# Patient Record
Sex: Male | Born: 1965 | Race: White | Hispanic: No | Marital: Married | Smoking: Former smoker
Health system: Southern US, Community
[De-identification: ages and names within clinical notes are randomized; demographics above are authoritative.]

## PROBLEM LIST (undated history)

## (undated) HISTORY — PX: EYE SURGERY: SHX253

---

## 2008-02-23 ENCOUNTER — Ambulatory Visit: Payer: Self-pay | Admitting: Orthopedic Surgery

## 2008-02-23 DIAGNOSIS — M1710 Unilateral primary osteoarthritis, unspecified knee: Secondary | ICD-10-CM

## 2008-02-23 DIAGNOSIS — M25569 Pain in unspecified knee: Secondary | ICD-10-CM

## 2008-02-23 DIAGNOSIS — M25469 Effusion, unspecified knee: Secondary | ICD-10-CM

## 2008-02-23 DIAGNOSIS — M23302 Other meniscus derangements, unspecified lateral meniscus, unspecified knee: Secondary | ICD-10-CM

## 2008-03-08 ENCOUNTER — Ambulatory Visit: Payer: Self-pay | Admitting: Orthopedic Surgery

## 2008-04-10 ENCOUNTER — Ambulatory Visit: Payer: Self-pay | Admitting: Orthopedic Surgery

## 2009-08-08 ENCOUNTER — Ambulatory Visit (HOSPITAL_COMMUNITY)
Admission: RE | Admit: 2009-08-08 | Discharge: 2009-08-08 | Payer: Self-pay | Admitting: Physical Medicine and Rehabilitation

## 2018-10-04 ENCOUNTER — Emergency Department (HOSPITAL_COMMUNITY): Payer: BLUE CROSS/BLUE SHIELD

## 2018-10-04 ENCOUNTER — Emergency Department (HOSPITAL_COMMUNITY)
Admission: EM | Admit: 2018-10-04 | Discharge: 2018-10-04 | Disposition: A | Discharge status: Home / Self Care | Payer: BLUE CROSS/BLUE SHIELD | Attending: Emergency Medicine | Admitting: Emergency Medicine

## 2018-10-04 ENCOUNTER — Other Ambulatory Visit: Payer: Self-pay

## 2018-10-04 ENCOUNTER — Encounter (HOSPITAL_COMMUNITY): Payer: Self-pay | Admitting: Emergency Medicine

## 2018-10-04 DIAGNOSIS — F17220 Nicotine dependence, chewing tobacco, uncomplicated: Secondary | ICD-10-CM | POA: Diagnosis not present

## 2018-10-04 DIAGNOSIS — R42 Dizziness and giddiness: Secondary | ICD-10-CM | POA: Diagnosis not present

## 2018-10-04 DIAGNOSIS — Z79899 Other long term (current) drug therapy: Secondary | ICD-10-CM | POA: Diagnosis not present

## 2018-10-04 LAB — URINALYSIS, ROUTINE W REFLEX MICROSCOPIC
BILIRUBIN URINE: NEGATIVE
Bacteria, UA: NONE SEEN
Glucose, UA: NEGATIVE mg/dL
KETONES UR: NEGATIVE mg/dL
Leukocytes, UA: NEGATIVE
NITRITE: NEGATIVE
PH: 7 (ref 5.0–8.0)
Protein, ur: NEGATIVE mg/dL
Specific Gravity, Urine: 1.003 — ABNORMAL LOW (ref 1.005–1.030)

## 2018-10-04 LAB — DIFFERENTIAL
Basophils Absolute: 0 10*3/uL (ref 0.0–0.1)
Basophils Relative: 0 %
Eosinophils Absolute: 0.1 10*3/uL (ref 0.0–0.5)
Eosinophils Relative: 1 %
Lymphocytes Relative: 17 %
Lymphs Abs: 1.3 10*3/uL (ref 0.7–4.0)
MONO ABS: 0.3 10*3/uL (ref 0.1–1.0)
Monocytes Relative: 4 %
Neutro Abs: 5.7 10*3/uL (ref 1.7–7.7)
Neutrophils Relative %: 77 %

## 2018-10-04 LAB — CBC
HCT: 44.5 % (ref 39.0–52.0)
HEMOGLOBIN: 15.3 g/dL (ref 13.0–17.0)
MCH: 30.1 pg (ref 26.0–34.0)
MCHC: 34.4 g/dL (ref 30.0–36.0)
MCV: 87.6 fL (ref 80.0–100.0)
NRBC: 0 % (ref 0.0–0.2)
Platelets: 198 10*3/uL (ref 150–400)
RBC: 5.08 MIL/uL (ref 4.22–5.81)
RDW: 12.4 % (ref 11.5–15.5)
WBC: 7.4 10*3/uL (ref 4.0–10.5)

## 2018-10-04 LAB — HEPATIC FUNCTION PANEL
ALT: 41 U/L (ref 0–44)
AST: 23 U/L (ref 15–41)
Albumin: 4.4 g/dL (ref 3.5–5.0)
Alkaline Phosphatase: 47 U/L (ref 38–126)
BILIRUBIN DIRECT: 0.2 mg/dL (ref 0.0–0.2)
BILIRUBIN INDIRECT: 1.1 mg/dL — AB (ref 0.3–0.9)
Total Bilirubin: 1.3 mg/dL — ABNORMAL HIGH (ref 0.3–1.2)
Total Protein: 6.9 g/dL (ref 6.5–8.1)

## 2018-10-04 LAB — BASIC METABOLIC PANEL
ANION GAP: 5 (ref 5–15)
BUN: 15 mg/dL (ref 6–20)
CO2: 25 mmol/L (ref 22–32)
Calcium: 8.5 mg/dL — ABNORMAL LOW (ref 8.9–10.3)
Chloride: 106 mmol/L (ref 98–111)
Creatinine, Ser: 0.87 mg/dL (ref 0.61–1.24)
Glucose, Bld: 107 mg/dL — ABNORMAL HIGH (ref 70–99)
POTASSIUM: 3.4 mmol/L — AB (ref 3.5–5.1)
SODIUM: 136 mmol/L (ref 135–145)

## 2018-10-04 LAB — CBG MONITORING, ED: Glucose-Capillary: 106 mg/dL — ABNORMAL HIGH (ref 70–99)

## 2018-10-04 LAB — TROPONIN I

## 2018-10-04 MED ORDER — SODIUM CHLORIDE 0.9 % IV BOLUS
1000.0000 mL | Freq: Once | INTRAVENOUS | Status: AC
  Administered 2018-10-04: 1000 mL via INTRAVENOUS

## 2018-10-04 NOTE — ED Provider Notes (Signed)
[facility] EMERGENCY DEPARTMENT Provider Note   CSN: [id number] Arrival date & time: 10/04/18  1534     History   Chief Complaint Chief Complaint  Patient presents with  . Dizziness    HPI Jonathon Balllan Morgan is a 52 y.o. male.  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  Patient now feels much better in the emergency department  The history is provided by the patient. No language interpreter was used.  Dizziness  Quality:  Lightheadedness Severity:  Mild Timing:  Rare Progression:  Resolved Chronicity:  New Context: bending over   Relieved by:  Nothing Worsened by:  Nothing Ineffective treatments:  None tried Associated symptoms: no chest pain, no diarrhea and no headaches     History reviewed. No pertinent past medical history.  Patient Active Problem List   Diagnosis Date Noted  . KNEE, ARTHRITIS, DEGEN./OSTEO 02/23/2008  . DERANGEMENT MENISCUS 02/23/2008  . JOINT EFFUSION, RIGHT KNEE 02/23/2008  . KNEE PAIN 02/23/2008    Past Surgical History:  Procedure Laterality Date  . EYE SURGERY          Home Medications    Prior to Admission medications   Medication Sig Start Date End Date Taking? Authorizing Provider  Flaxseed, Linseed, (FLAXSEED OIL) 1000 MG CAPS Take 1 capsule by mouth daily.   Yes [provider]  Multiple Vitamin (MULTIVITAMIN WITH MINERALS) TABS tablet Take 1 tablet by mouth daily.   Yes [provider]    Family History Family History  Problem Relation Age of Onset  . Heart attack Other     Social History Social History   Tobacco Use  . Smoking status: Former Games developermoker  . Smokeless tobacco: Current User    Types: Snuff  Substance Use Topics  . Alcohol use: Yes    Comment: occas  . Drug use: Never     Allergies   Patient has no known allergies.   Review of Systems Review of Systems    Constitutional: Negative for appetite change and fatigue.  HENT: Negative for congestion, ear discharge and sinus pressure.   Eyes: Negative for discharge.  Respiratory: Negative for cough.   Cardiovascular: Negative for chest pain.  Gastrointestinal: Negative for abdominal pain and diarrhea.  Genitourinary: Negative for frequency and hematuria.  Musculoskeletal: Negative for back pain.  Skin: Negative for rash.  Neurological: Positive for dizziness. Negative for seizures and headaches.  Psychiatric/Behavioral: Negative for hallucinations.     Physical Exam Updated Vital Signs BP 114/83   Pulse 70   Temp 98 F (36.7 C) (Oral)   Resp 20   Ht 5\' 9"  (1.753 m)   Wt 111.1 kg   SpO2 98%   BMI 36.18 kg/m   Physical Exam Constitutional:      Appearance: He is well-developed.  HENT:     Head: Normocephalic.     Right Ear: Tympanic membrane normal.     Nose: Nose normal.     Mouth/Throat:     Mouth: Mucous membranes are moist.  Eyes:     General: No scleral icterus.    Conjunctiva/sclera: Conjunctivae normal.  Neck:     Musculoskeletal: Neck supple.     Thyroid: No thyromegaly.  Cardiovascular:     Rate and Rhythm: Normal rate and regular rhythm.     Heart sounds: No murmur. No friction rub. No gallop.  Pulmonary:     Breath sounds: No stridor. No wheezing or rales.  Chest:     Chest wall: No tenderness.  Abdominal:     General: There is no distension.     Tenderness: There is no abdominal tenderness. There is no rebound.  Musculoskeletal: Normal range of motion.  Lymphadenopathy:     Cervical: No cervical adenopathy.  Skin:    Findings: No erythema or rash.  Neurological:     Mental Status: He is oriented to person, place, and time.     Motor: No abnormal muscle tone.     Coordination: Coordination normal.  Psychiatric:        Behavior: Behavior normal.      ED Treatments / Results  Labs (all labs ordered are listed, but only abnormal results are  displayed) Labs Reviewed  BASIC METABOLIC PANEL - Abnormal; Notable for the following components:      Result Value   Potassium 3.4 (*)    Glucose, Bld 107 (*)    Calcium 8.5 (*)    All other components within normal limits  URINALYSIS, ROUTINE W REFLEX MICROSCOPIC - Abnormal; Notable for the following components:   Color, Urine STRAW (*)    Specific Gravity, Urine 1.003 (*)    Hgb urine dipstick SMALL (*)    All other components within normal limits  HEPATIC FUNCTION PANEL - Abnormal; Notable for the following components:   Total Bilirubin 1.3 (*)    Indirect Bilirubin 1.1 (*)    All other components within normal limits  CBG MONITORING, ED - Abnormal; Notable for the following components:   Glucose-Capillary 106 (*)    All other components within normal limits  CBC  TROPONIN I  DIFFERENTIAL    EKG EKG Interpretation  Date/Time:  Monday October 04 2018 16:15:38 EST Ventricular Rate:  72 PR Interval:    QRS Duration: 98 QT Interval:  404 QTC Calculation: 443 R Axis:   -29 Text Interpretation:  Sinus rhythm Borderline left axis deviation Consider anterior infarct Baseline wander in lead(s) V1 Confirmed by Bethann Berkshire [phone]) on 10/04/2018 7:26:07 PM   Radiology Dg Chest 2 View  Result Date: 10/04/2018 CLINICAL DATA:  Chest pain and near syncope. EXAM: CHEST - 2 VIEW COMPARISON:  None. FINDINGS: The heart size and mediastinal contours are within normal limits. Normal pulmonary vascularity. No focal consolidation, pleural effusion, or pneumothorax. No acute osseous abnormality. IMPRESSION: No active cardiopulmonary disease. Electronically Signed   By: Obie Dredge M.D.   On: 10/04/2018 17:20   Ct Head Wo Contrast  Result Date: 10/04/2018 CLINICAL DATA:  Dizziness EXAM: CT HEAD WITHOUT CONTRAST TECHNIQUE: Contiguous axial images were obtained from the base of the skull through the vertex without intravenous contrast. COMPARISON:  None. FINDINGS: Brain: No evidence of  acute infarction, hemorrhage, hydrocephalus, extra-axial collection or mass lesion/mass effect. Vascular: No hyperdense vessel or unexpected calcification. Skull: Normal. Negative for fracture or focal lesion. Sinuses/Orbits: Postsurgical changes involving the left globe. The visualized paranasal sinuses are essentially clear. The mastoid air cells are unopacified. Other: None. IMPRESSION: No evidence of acute intracranial abnormality. Electronically Signed   By: Charline Bills M.D.   On: 10/04/2018 17:00    Procedures Procedures (including critical care time)  Medications Ordered in ED Medications  sodium chloride 0.9 % bolus 1,000 mL (1,000 mLs Intravenous New Bag/Given 10/04/18 1832)  sodium chloride 0.9 % bolus 1,000 mL (1,000 mLs Intravenous New Bag/Given 10/04/18 1831)     Initial Impression / Assessment  and Plan / ED Course  I have reviewed the triage vital signs and the nursing notes.  Pertinent labs & imaging results that were available during my care of the patient were reviewed by me and considered in my medical decision making (see chart for details).     Labs unremarkable.  Patient was orthostatic.  So he was given some fluids.  Patient symptoms improved with fluids.  Doubt coronary artery disease or stroke.  Patient will follow-up with PCP  Final Clinical Impressions(s) / ED Diagnoses   Final diagnoses:  Dizziness    ED Discharge Orders    None       Bethann BerkshireZammit, Yarianna Varble, MD 10/04/18 [phone]

## 2018-10-04 NOTE — Discharge Instructions (Signed)
Follow-up with your family doctor next week for recheck.  Return if any problems °

## 2018-10-04 NOTE — ED Triage Notes (Signed)
Patient reports dizziness that started on Friday. Patient states he feels 'kind of off balance." Reports onset of tingling in his hands and feet about an hour ago. C/O L sided chest pain that feels like pressure.

## 2018-10-04 NOTE — ED Notes (Signed)
Pt to CT and xray  

## 2018-10-04 NOTE — ED Notes (Signed)
Pt states no dizziness while doing orthostatics. Request RN to check his ears for fluid. RN notified.

## 2020-01-21 IMAGING — CT CT HEAD W/O CM
3 series · 16 of 47 positions shown, 19 images · non-contrast
Comparison: None.

CLINICAL DATA: Dizziness

EXAM:
CT HEAD WITHOUT CONTRAST
TECHNIQUE: Contiguous axial images were obtained from the base of the skull
through the vertex without intravenous contrast.

[Series 2: head wo · axial · 0.43mm/px · z∈[+28,+158]mm · 10 of 32 slices shown, 13 images]
[im 3/32  brain]
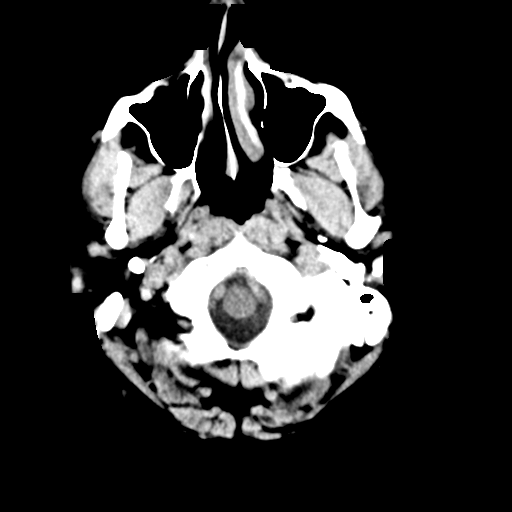
[im 3/32  bone]
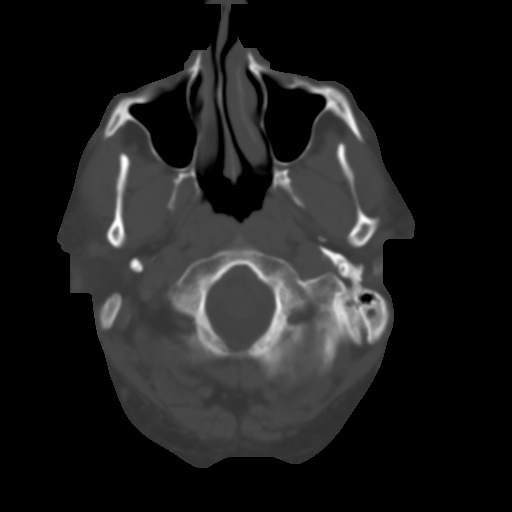
[im 6/32  brain]
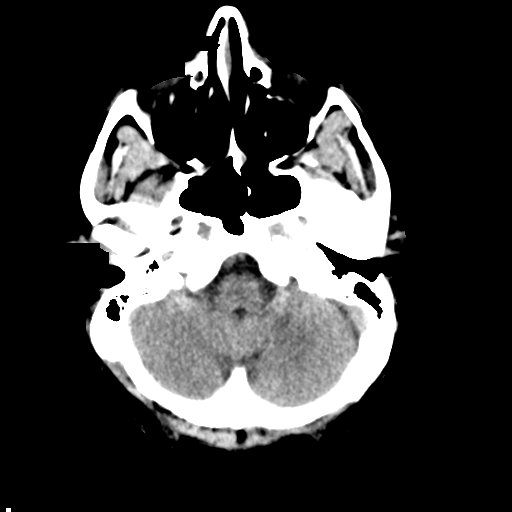
[im 9/32  brain]
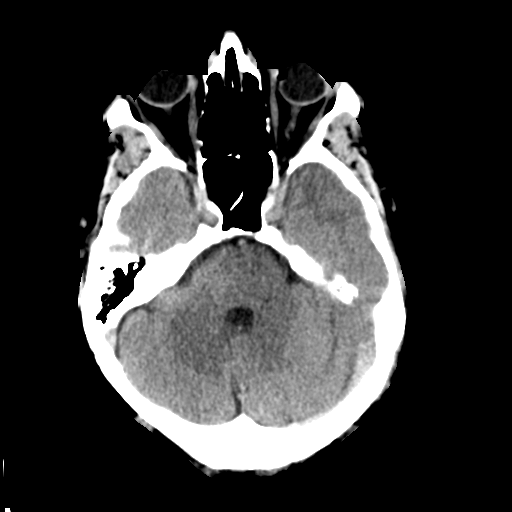
[im 11/32  brain]
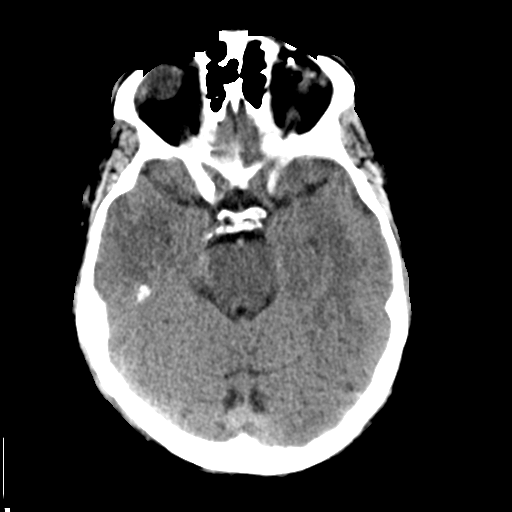
[im 14/32  brain]
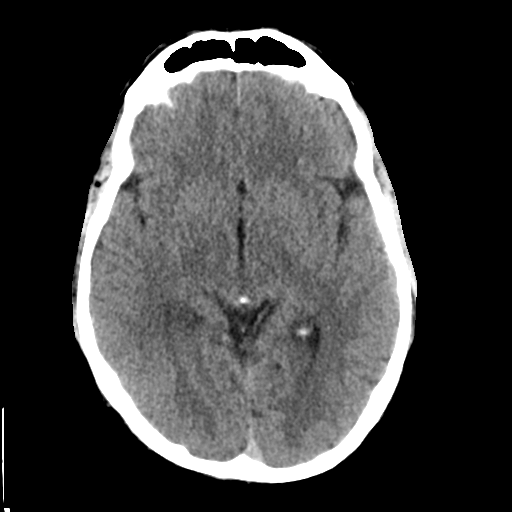
[im 14/32  bone]
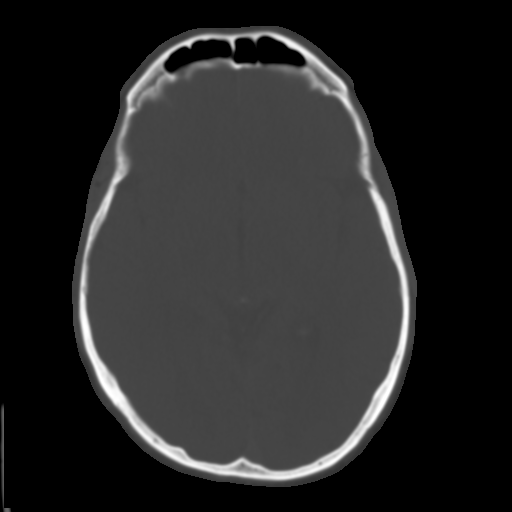
[im 18/32  brain]
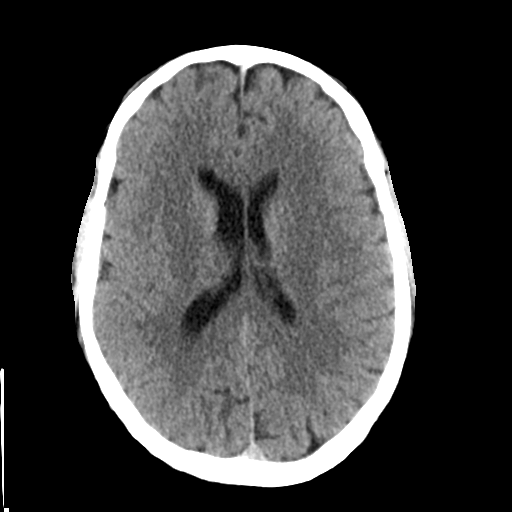
[im 21/32  brain]
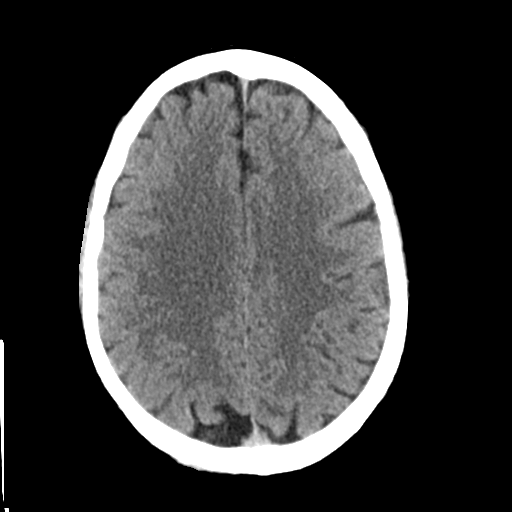
[im 24/32  brain]
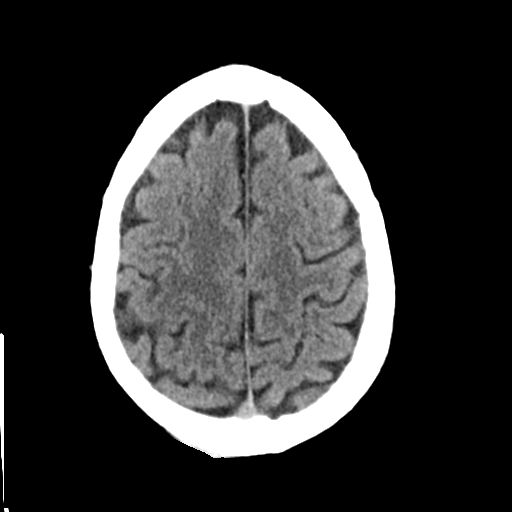
[im 26/32  brain]
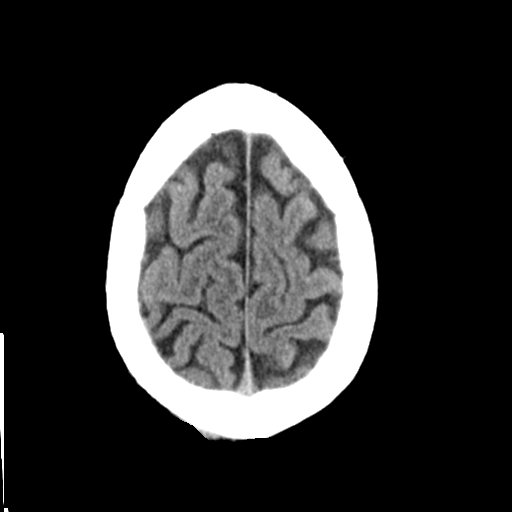
[im 26/32  bone]
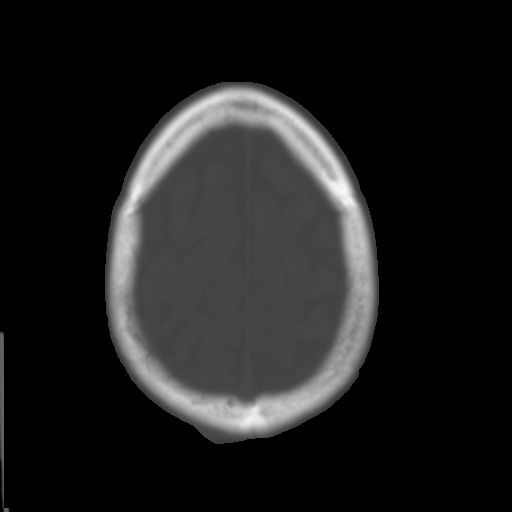
[im 29/32  brain]
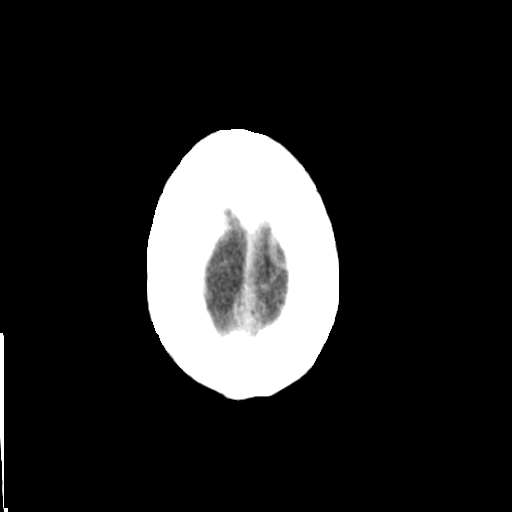

[Series 4: coronal soft tissue · coronal · 0.33mm/px · 3 of 70 slices shown]
[im 24/70  brain]
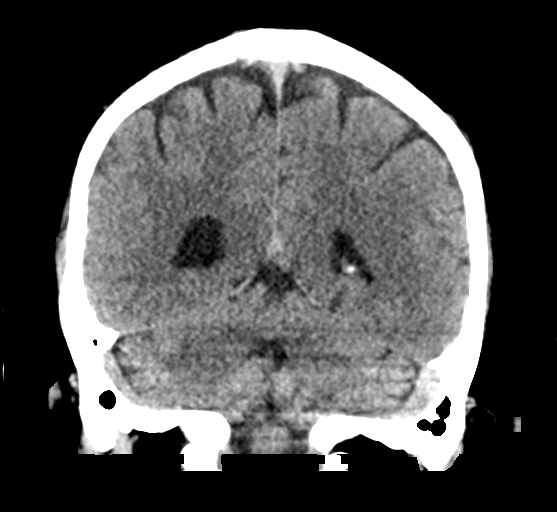
[im 31/70  brain]
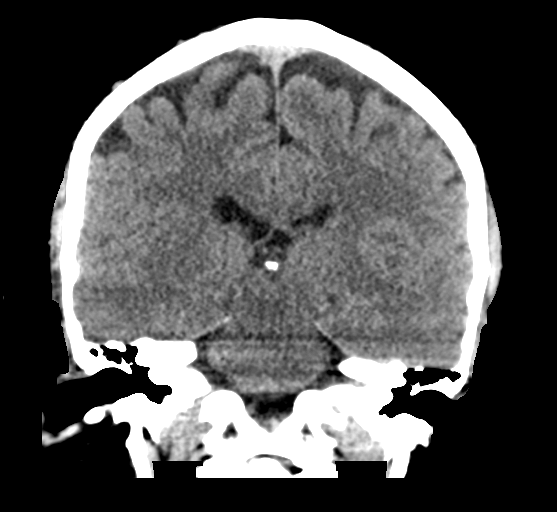
[im 39/70  brain]
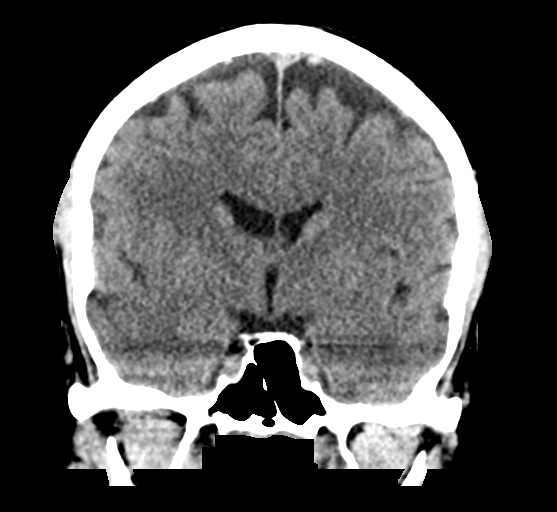

[Series 5: sagittal soft tissue · sagittal · 0.33mm/px · 3 of 57 slices shown]
[im 19/57  brain]
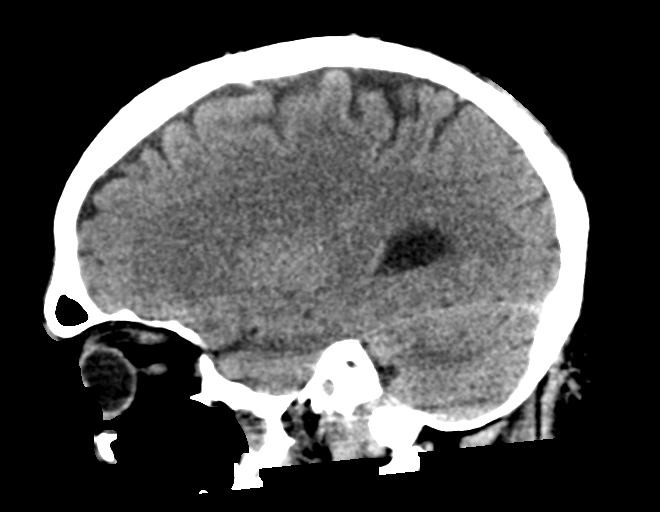
[im 29/57  brain]
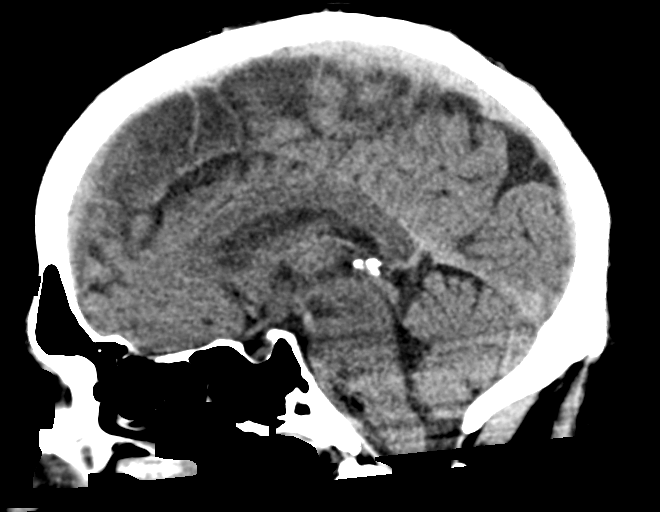
[im 38/57  brain]
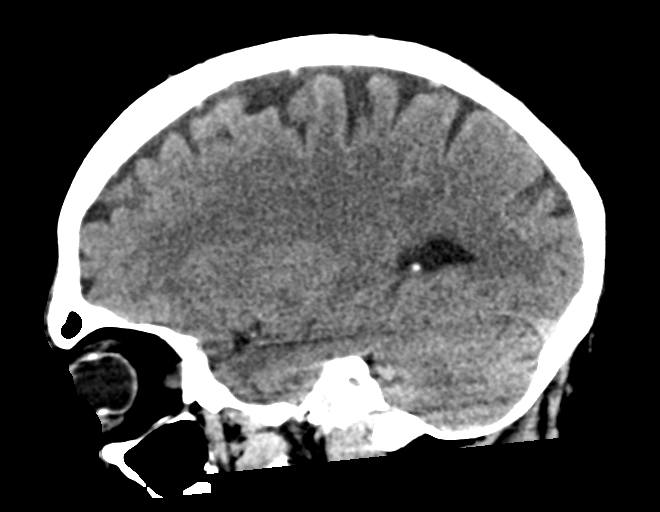

[16 of 47 positions shown; findings below may reference images not displayed]

FINDINGS: Brain: No evidence of acute infarction, hemorrhage, hydrocephalus,
extra-axial collection or mass lesion/mass effect.

Vascular: No hyperdense vessel or unexpected calcification.

Skull: Normal. Negative for fracture or focal lesion.

Sinuses/Orbits: Postsurgical changes involving the left globe. The
visualized paranasal sinuses are essentially clear. The mastoid air
cells are unopacified.

Other: None.
IMPRESSION: No evidence of acute intracranial abnormality.

## 2020-01-21 IMAGING — DX DG CHEST 2V
2 series · 2 of 2 positions shown · non-contrast
Comparison: None.

CLINICAL DATA: Chest pain and near syncope.

EXAM:
CHEST - 2 VIEW

[chest pa]
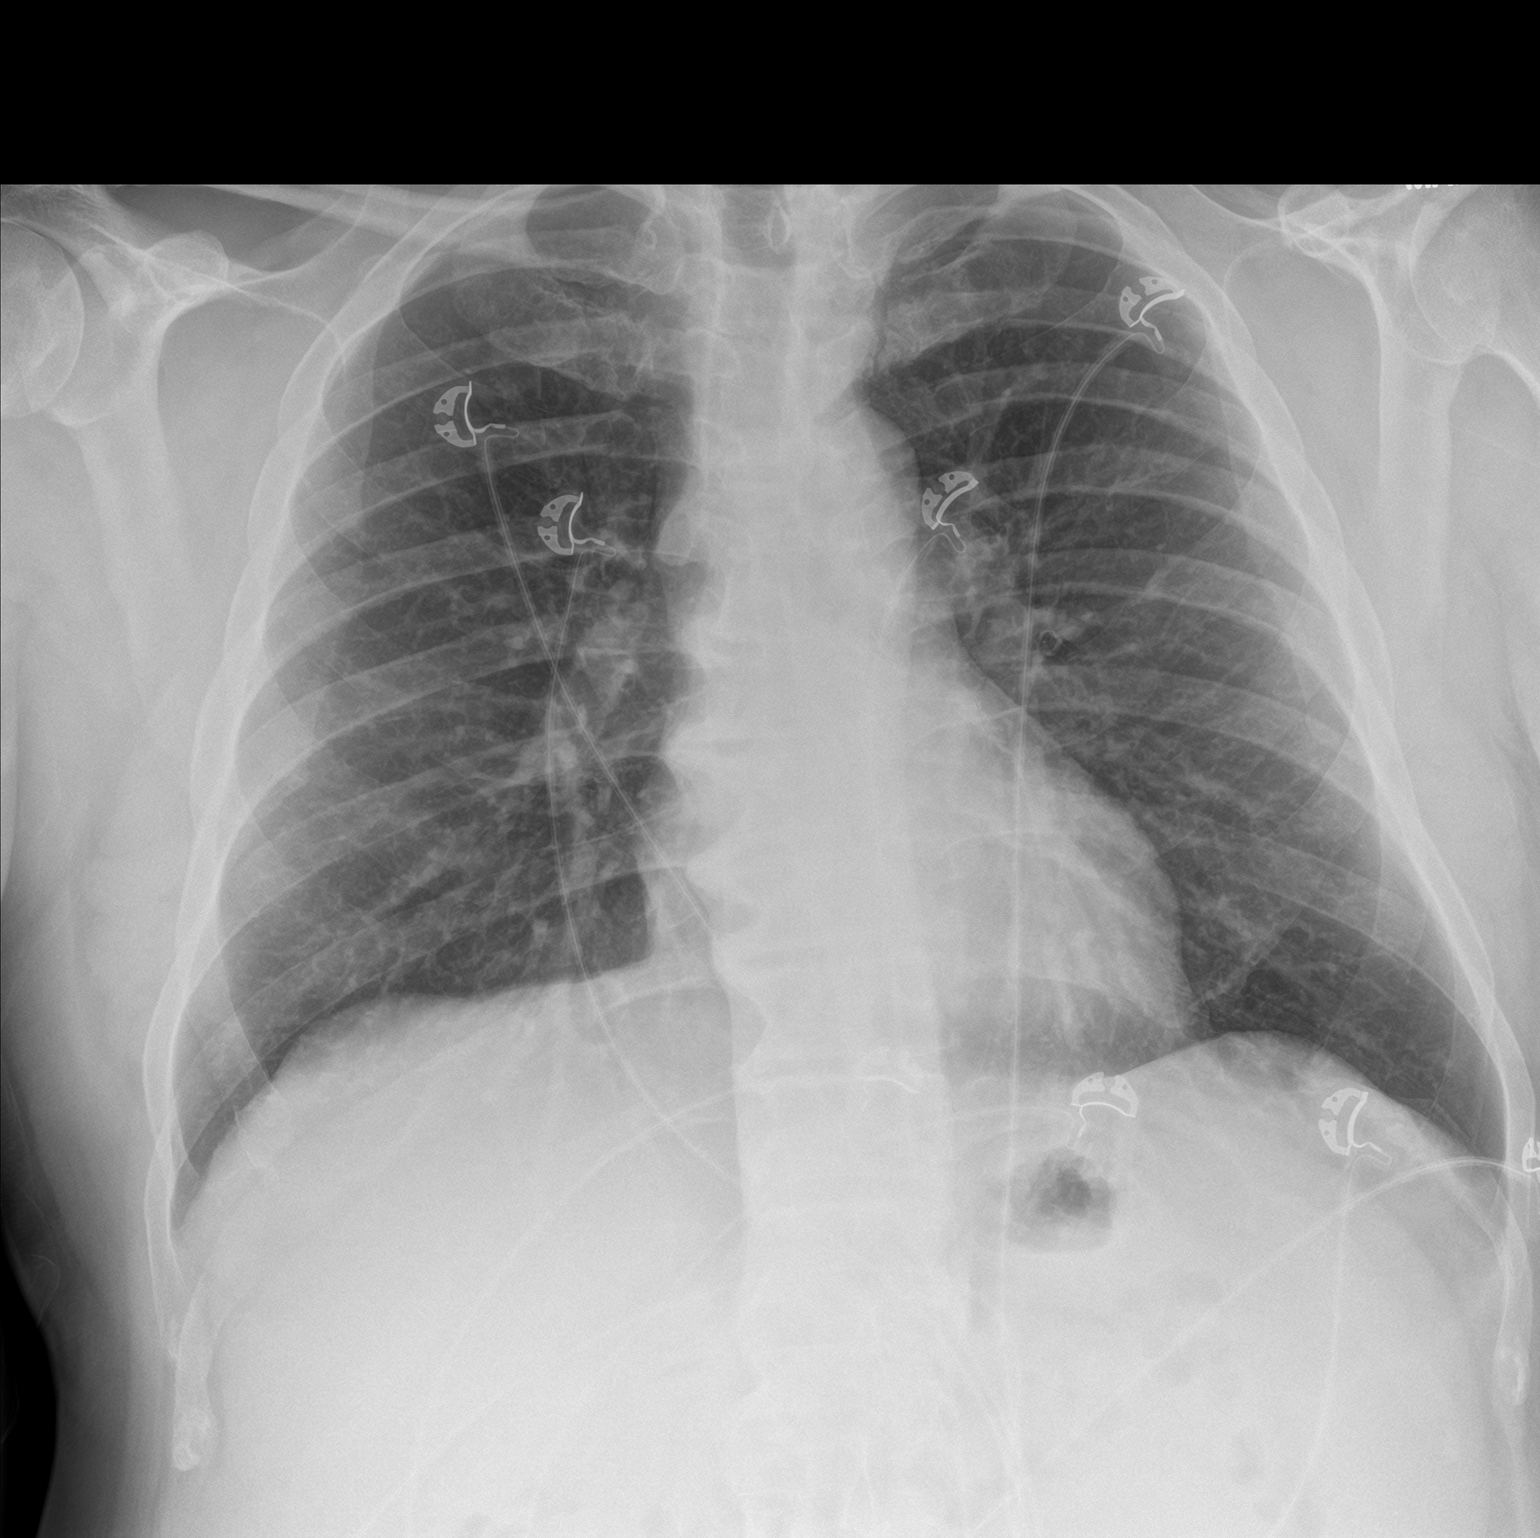

[chest lat]
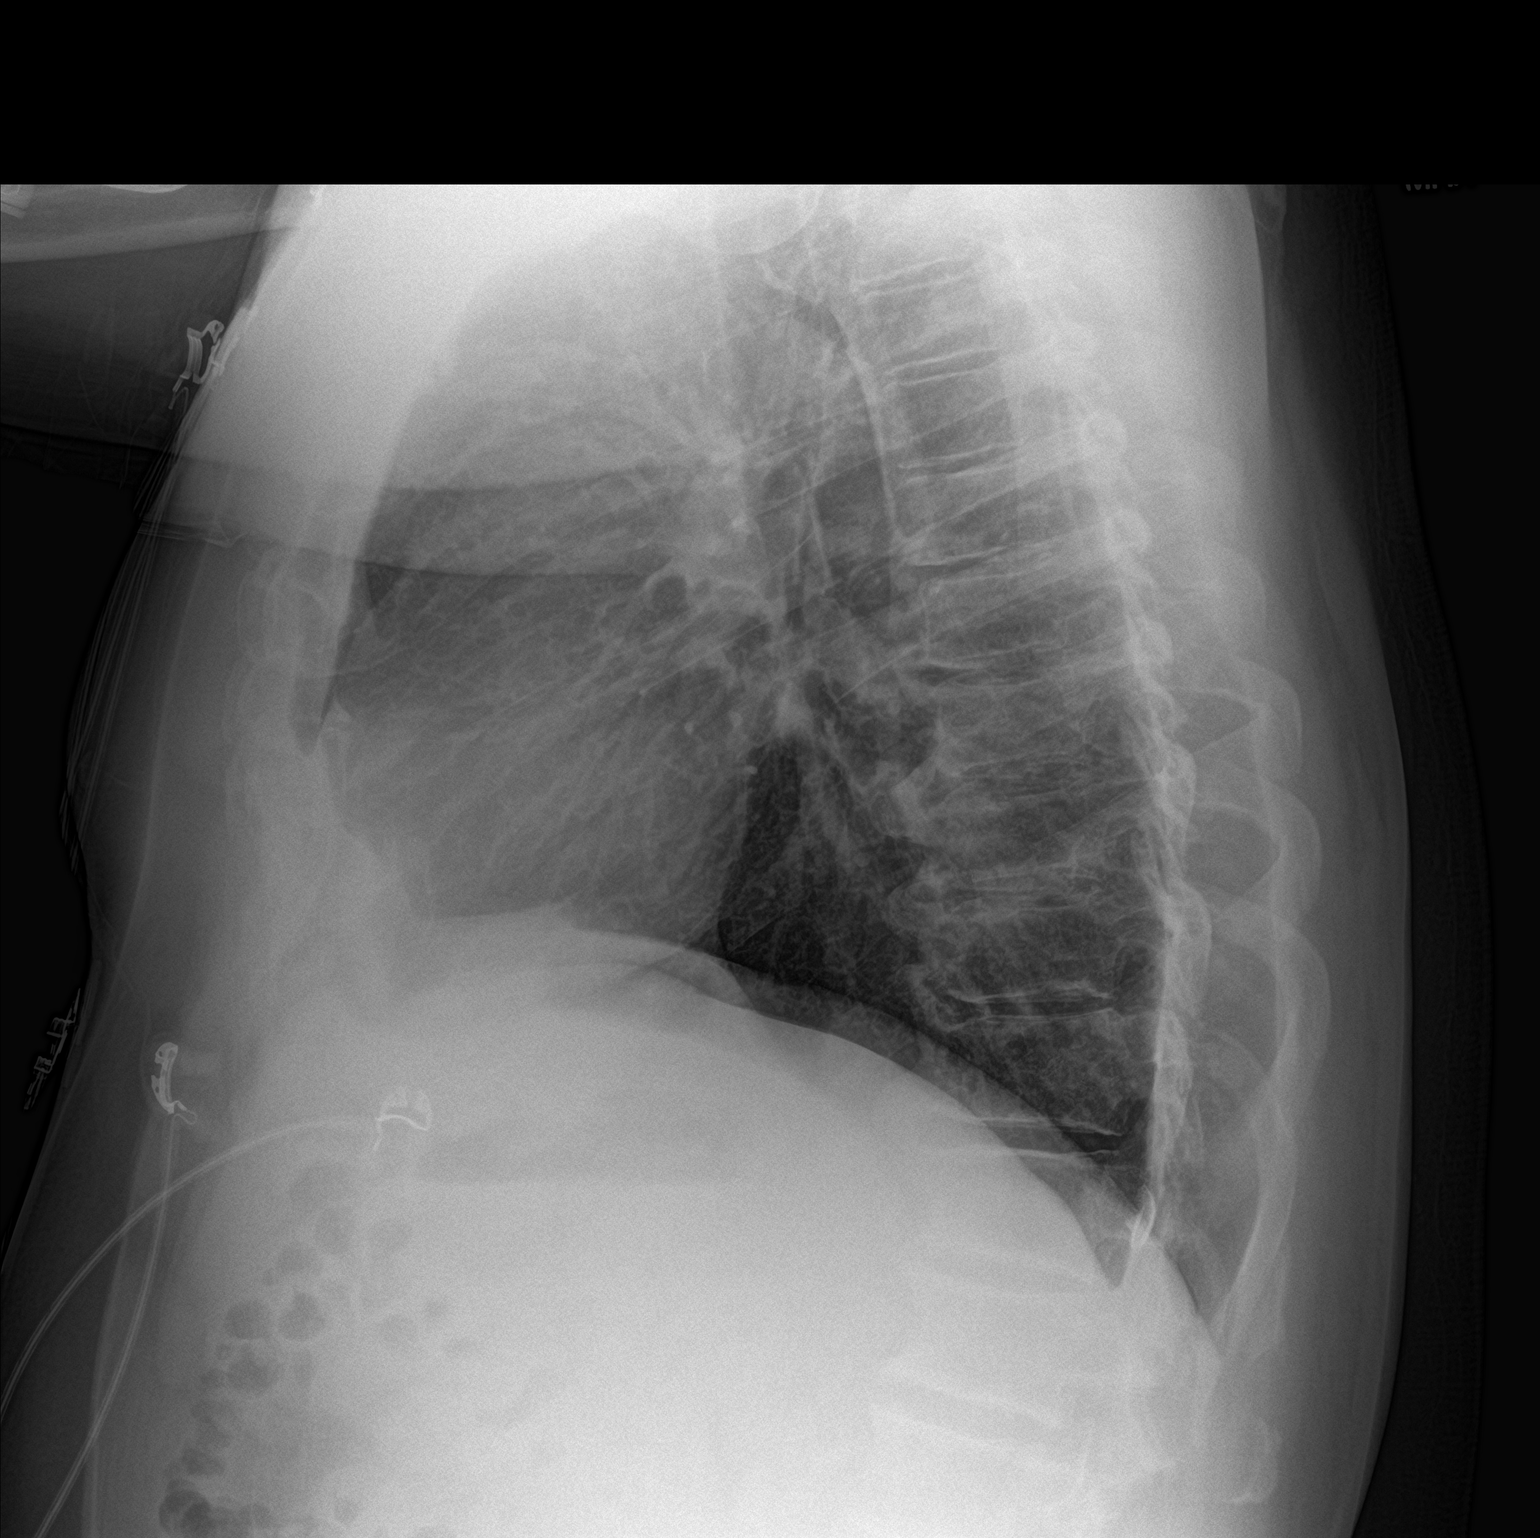

[2 of 2 positions shown; findings below may reference images not displayed]

FINDINGS: The heart size and mediastinal contours are within normal limits.
Normal pulmonary vascularity. No focal consolidation, pleural
effusion, or pneumothorax. No acute osseous abnormality.
IMPRESSION: No active cardiopulmonary disease.
